# Patient Record
Sex: Male | Born: 1975 | Race: White | Hispanic: Yes | Marital: Married | Smoking: Never smoker
Health system: Southern US, Community
[De-identification: ages and names within clinical notes are randomized; demographics above are authoritative.]

---

## 2006-08-15 ENCOUNTER — Ambulatory Visit (HOSPITAL_BASED_OUTPATIENT_CLINIC_OR_DEPARTMENT_OTHER): Admission: RE | Admit: 2006-08-15 | Discharge: 2006-08-15 | Payer: Self-pay | Admitting: Orthopedic Surgery

## 2016-04-22 ENCOUNTER — Emergency Department (HOSPITAL_COMMUNITY): Payer: Worker's Compensation

## 2016-04-22 ENCOUNTER — Emergency Department (HOSPITAL_COMMUNITY)
Admission: EM | Admit: 2016-04-22 | Discharge: 2016-04-22 | Disposition: A | Discharge status: Home / Self Care | Payer: Worker's Compensation | Attending: Emergency Medicine | Admitting: Emergency Medicine

## 2016-04-22 ENCOUNTER — Encounter (HOSPITAL_COMMUNITY): Payer: Self-pay | Admitting: Emergency Medicine

## 2016-04-22 DIAGNOSIS — Y93D3 Activity, furniture building and finishing: Secondary | ICD-10-CM | POA: Insufficient documentation

## 2016-04-22 DIAGNOSIS — Y990 Civilian activity done for income or pay: Secondary | ICD-10-CM | POA: Insufficient documentation

## 2016-04-22 DIAGNOSIS — Z23 Encounter for immunization: Secondary | ICD-10-CM | POA: Diagnosis not present

## 2016-04-22 DIAGNOSIS — W294XXA Contact with nail gun, initial encounter: Secondary | ICD-10-CM | POA: Insufficient documentation

## 2016-04-22 DIAGNOSIS — W450XXA Nail entering through skin, initial encounter: Secondary | ICD-10-CM

## 2016-04-22 DIAGNOSIS — Y929 Unspecified place or not applicable: Secondary | ICD-10-CM | POA: Insufficient documentation

## 2016-04-22 DIAGNOSIS — S51841A Puncture wound with foreign body of right forearm, initial encounter: Secondary | ICD-10-CM | POA: Insufficient documentation

## 2016-04-22 MED ORDER — IBUPROFEN 800 MG PO TABS: 800.0000 mg | ORAL_TABLET | Freq: Three times a day (TID) | ORAL | Status: AC | PRN

## 2016-04-22 MED ORDER — CEPHALEXIN 500 MG PO CAPS: 1000.0000 mg | ORAL_CAPSULE | Freq: Two times a day (BID) | ORAL | Status: AC

## 2016-04-22 MED ORDER — TETANUS-DIPHTH-ACELL PERTUSSIS 5-2.5-18.5 LF-MCG/0.5 IM SUSP
0.5000 mL | Freq: Once | INTRAMUSCULAR | Status: AC
  Administered 2016-04-22: 0.5 mL via INTRAMUSCULAR
  Filled 2016-04-22: qty 0.5

## 2016-04-22 NOTE — ED Notes (Signed)
Pt's coworker informed this RN that pt has give urine specimen for drug screen per company policy.

## 2016-04-22 NOTE — ED Notes (Signed)
Pt was at work repairing a pallet and impaled his right forearm with a nail. Nail stabilized in triage. No active bleeding.

## 2016-04-22 NOTE — ED Notes (Signed)
PA removed nail from pt's arm. Pt tolerated very well.

## 2016-04-22 NOTE — ED Provider Notes (Addendum)
CSN: [id number]     Arrival date & time 04/22/16  1404 History  By signing my name below, I, Doreatha Martin, attest that this documentation has been prepared under the direction and in the presence of  [employer], PA-C. Electronically Signed: Doreatha Martin, ED Scribe. 04/22/2016. 3:17 PM.     Chief Complaint  Patient presents with  . Arm Injury   The history is provided by the patient. No language interpreter was used.   HPI Comments: Caleb Bishop is a 40 y.o. male who presents to the Emergency Department complaining that a metal nail punctured his right forearm this afternoon. Pt states that he was using a nailgun that ejected a nail which deflected off a board and subsequently punctured his right forearm. He is able to move all digits. Pt has not attempted to remove the nail. No worsening or alleviating factors noted. Tdap out of date. He denies numbness, additional injuries or complaints.   History reviewed. No pertinent past medical history. History reviewed. No pertinent past surgical history. History reviewed. No pertinent family history. Social History  Substance Use Topics  . Smoking status: Never Smoker   . Smokeless tobacco: None  . Alcohol Use: Yes     Comment: socially    Review of Systems A complete 10 system review of systems was obtained and all systems are negative except as noted in the HPI and PMH.    Allergies  Review of patient's allergies indicates no known allergies.  Home Medications   Prior to Admission medications   Not on File   BP 145/102 mmHg  Pulse 82  Temp(Src) 99 F (37.2 C) (Oral)  Resp 16  SpO2 97% Physical Exam  Constitutional: He appears well-developed and well-nourished.  HENT:  Head: Normocephalic.  Eyes: Conjunctivae are normal.  Cardiovascular: Normal rate.   Pulmonary/Chest: Effort normal. No respiratory distress.  Abdominal: He exhibits no distension.  Musculoskeletal: Normal range of motion.  Nail present to mid proximal  right forearm anteriorly. Able to move all digits. NO ROM issues  Neurological: He is alert.  Sensation intact to distal RUE. Strength grossly intact to RUE.  Skin: Skin is warm and dry.  Psychiatric: He has a normal mood and affect. His behavior is normal.  Nursing note and vitals reviewed.   ED Course  Procedures (including critical care time) DIAGNOSTIC STUDIES: Oxygen Saturation is 97% on RA, normal by my interpretation.    COORDINATION OF CARE: 3:14 PM Discussed treatment plan with pt at bedside which includes XR, foreign body removal and pt agreed to plan.   Imaging Review Dg Forearm Right  04/22/2016  CLINICAL DATA:  Nail and right forearm. EXAM: RIGHT FOREARM - 2 VIEW COMPARISON:  None. FINDINGS: There is a bent, but otherwise intact, nail within the superficial soft tissues of the anterior proximal right forearm. No acute fracture. Old distal radius fracture has been reduced with a volar fixation plate and screws. The elbow and wrist joints are normally spaced and aligned. IMPRESSION: Intact nail lies within the superficial soft tissues of the anterior proximal right forearm. Electronically Signed   By: Amie Portland M.D.   On: 04/22/2016 14:47   I have personally reviewed and evaluated these images as part of my medical decision-making.  FOREIGN BODY REMOVAL Date/Time: 04/22/2016 3:16 PM  Performed by: Ebbie Ridge, PA-C  Consent: Verbal consent obtained. Risks and benefits: risks, benefits and alternatives were discussed Consent given by: Patient  Patient understanding: patient states understanding of the procedure being  performed  Patient consent: the patient's understanding of the procedure matches consent given  Procedure consent: procedure consent matches procedure scheduled  Patient identity confirmed: verbally with patient, arm band and hospital-assigned identification number  Time out: Immediately prior to procedure a "time out" was called to verify the correct  patient, procedure, equipment, support staff and site/side marked as required. Body area: skin Location: anterior right forearm  Anesthesia: local infiltration Local anesthetic: 2% lidocaine without epi Anesthetic total: 3 mL Patient cooperative: yes Dressing: antibiotic ointment Tendon involvement: none Complexity: complex 1 objects recovered. Objects recovered: nail  Post-procedure assessment: foreign body removed Patient tolerance: Patient tolerated the procedure well with no immediate complications. Removal technique: Forceps and extraction.   MDM   Final diagnoses:  None    Tetanus updated in the ED. Foreign body successfully removed in the ED. Puncture wound not closed secondary to concern for infection. Pt will be sent home with keflex. Discussed wound care with pt and answered questions. Discussed strict return precautions. Pt to f/u for wound check sooner should there be signs of dehiscence or infection. Pt is hemodynamically stable with no complaints prior to dc.        I personally performed the services described in this documentation, which was scribed in my presence. The recorded information has been reviewed and is accurate.   Charlestine NightChristopher Kareen Jefferys, PA-C 04/22/16 1604  Geoffery Lyonsouglas Delo, MD 04/22/16 1624  Charlestine Nighthristopher Rayen Dafoe, PA-C 05/17/16 [id number]  Geoffery Lyonsouglas Delo, MD 05/17/16 [id number]

## 2016-04-22 NOTE — Discharge Instructions (Signed)
Return here as needed. Keep the area clean and dry °

## 2016-04-22 NOTE — ED Notes (Signed)
PA in room

## 2016-04-22 NOTE — ED Notes (Signed)
Phlebotomy in room to collect w/c drug screen.

## 2016-04-22 NOTE — ED Notes (Signed)
Pt verbalized understanding of d/c instructions and has no further questions. Pt stable and NAD.  

## 2017-05-10 IMAGING — CR DG FOREARM 2V*R*
2 series · 2 of 2 positions shown · non-contrast
Comparison: None.

CLINICAL DATA: Nail and right forearm.

EXAM:
RIGHT FOREARM - 2 VIEW

[forearm ap]
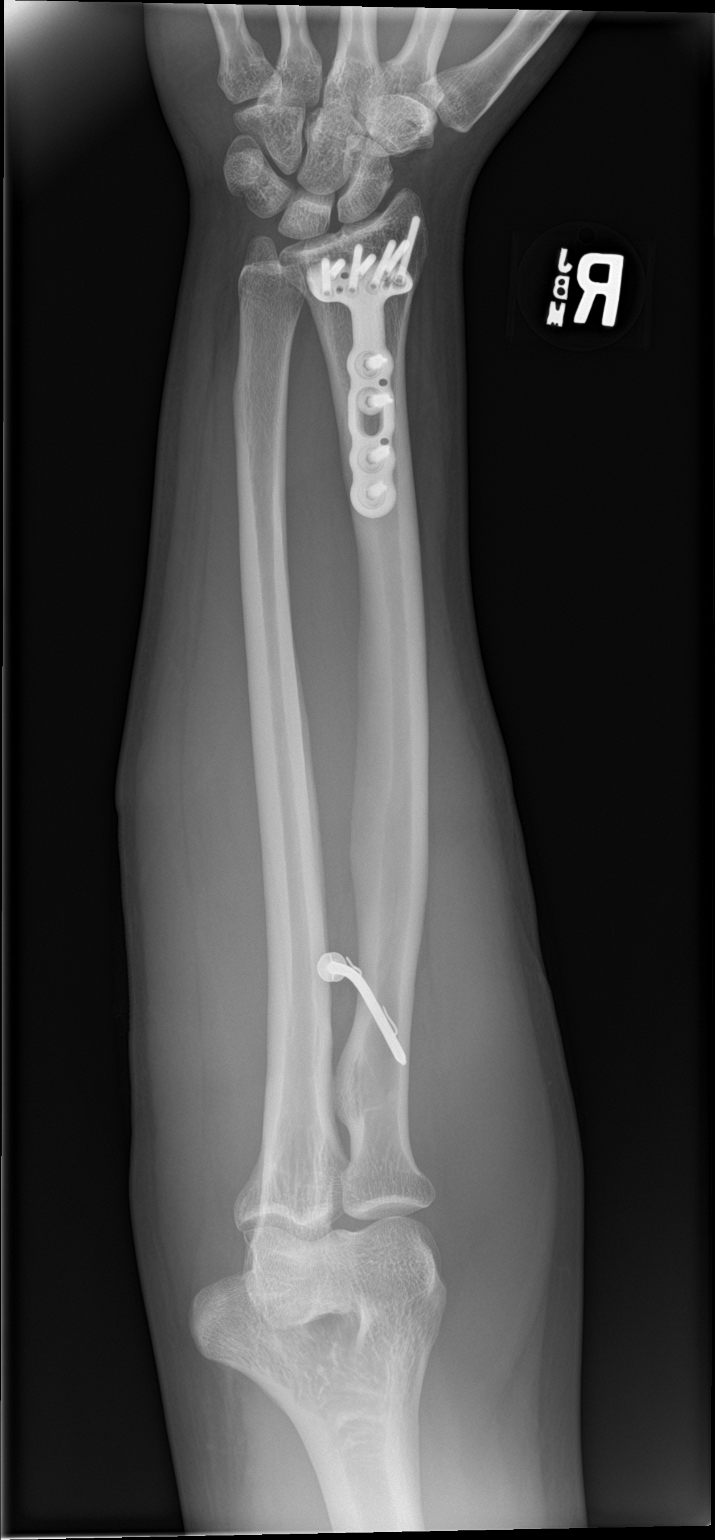

[forearm lat]
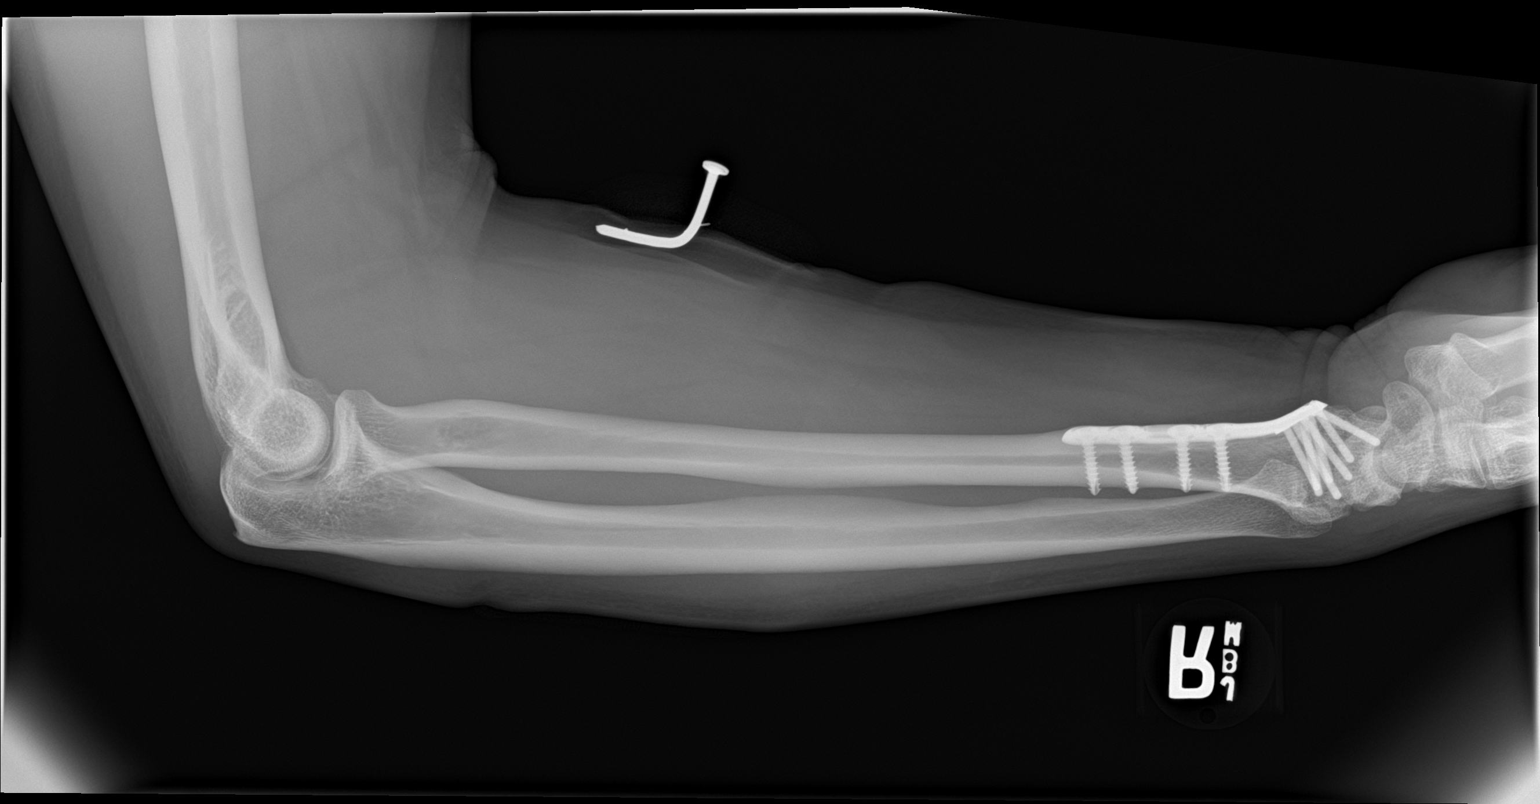

[2 of 2 positions shown; findings below may reference images not displayed]

FINDINGS: There is a bent, but otherwise intact, nail within the superficial
soft tissues of the anterior proximal right forearm.

No acute fracture. Old distal radius fracture has been reduced with
a volar fixation plate and screws.

The elbow and wrist joints are normally spaced and aligned.
IMPRESSION: Intact nail lies within the superficial soft tissues of the anterior
proximal right forearm.
